# Patient Record
Sex: Male | Born: 2000 | Race: Black or African American | Hispanic: No | Marital: Single | Smoking: Current some day smoker
Health system: Southern US, Community
[De-identification: ages and names within clinical notes are randomized; demographics above are authoritative.]

## PROBLEM LIST (undated history)

## (undated) DIAGNOSIS — J45909 Unspecified asthma, uncomplicated: Secondary | ICD-10-CM

## (undated) DIAGNOSIS — W3400XA Accidental discharge from unspecified firearms or gun, initial encounter: Secondary | ICD-10-CM

## (undated) HISTORY — PX: TONSILLECTOMY: SUR1361

## (undated) HISTORY — PX: ADENOIDECTOMY: SUR15

## (undated) HISTORY — PX: UMBILICAL HERNIA REPAIR: SHX196

---
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
## 2023-06-01 ENCOUNTER — Encounter (HOSPITAL_BASED_OUTPATIENT_CLINIC_OR_DEPARTMENT_OTHER): Payer: Self-pay | Admitting: Orthopedic Surgery

## 2023-06-01 ENCOUNTER — Other Ambulatory Visit: Payer: Self-pay

## 2023-06-06 ENCOUNTER — Ambulatory Visit (HOSPITAL_BASED_OUTPATIENT_CLINIC_OR_DEPARTMENT_OTHER): Admitting: Anesthesiology

## 2023-06-06 ENCOUNTER — Ambulatory Visit (HOSPITAL_BASED_OUTPATIENT_CLINIC_OR_DEPARTMENT_OTHER)

## 2023-06-06 ENCOUNTER — Other Ambulatory Visit: Payer: Self-pay

## 2023-06-06 ENCOUNTER — Encounter (HOSPITAL_BASED_OUTPATIENT_CLINIC_OR_DEPARTMENT_OTHER)
Admission: RE | Disposition: A | Discharge status: Home / Self Care | Payer: Self-pay | Source: Home / Self Care | Attending: Orthopedic Surgery

## 2023-06-06 ENCOUNTER — Encounter (HOSPITAL_BASED_OUTPATIENT_CLINIC_OR_DEPARTMENT_OTHER): Payer: Self-pay | Admitting: Orthopedic Surgery

## 2023-06-06 ENCOUNTER — Ambulatory Visit (HOSPITAL_BASED_OUTPATIENT_CLINIC_OR_DEPARTMENT_OTHER)
Admission: RE | Admit: 2023-06-06 | Discharge: 2023-06-06 | Disposition: A | Discharge status: Home / Self Care | Attending: Orthopedic Surgery | Admitting: Orthopedic Surgery

## 2023-06-06 DIAGNOSIS — F17290 Nicotine dependence, other tobacco product, uncomplicated: Secondary | ICD-10-CM | POA: Insufficient documentation

## 2023-06-06 DIAGNOSIS — S62617A Displaced fracture of proximal phalanx of left little finger, initial encounter for closed fracture: Secondary | ICD-10-CM | POA: Diagnosis not present

## 2023-06-06 DIAGNOSIS — W3400XA Accidental discharge from unspecified firearms or gun, initial encounter: Secondary | ICD-10-CM | POA: Diagnosis not present

## 2023-06-06 DIAGNOSIS — S61432A Puncture wound without foreign body of left hand, initial encounter: Secondary | ICD-10-CM | POA: Diagnosis present

## 2023-06-06 DIAGNOSIS — S62617B Displaced fracture of proximal phalanx of left little finger, initial encounter for open fracture: Secondary | ICD-10-CM

## 2023-06-06 DIAGNOSIS — J45909 Unspecified asthma, uncomplicated: Secondary | ICD-10-CM | POA: Insufficient documentation

## 2023-06-06 HISTORY — PX: INCISION AND DRAINAGE OF WOUND: SHX1803

## 2023-06-06 HISTORY — PX: OPEN REDUCTION INTERNAL FIXATION (ORIF) DISTAL PHALANX: SHX6236

## 2023-06-06 HISTORY — DX: Accidental discharge from unspecified firearms or gun, initial encounter: W34.00XA

## 2023-06-06 SURGERY — IRRIGATION AND DEBRIDEMENT WOUND
Anesthesia: Monitor Anesthesia Care | Site: Little Finger | Laterality: Left

## 2023-06-06 MED ORDER — MIDAZOLAM HCL 2 MG/2ML IJ SOLN
2.0000 mg | Freq: Once | INTRAMUSCULAR | Status: AC
  Administered 2023-06-06: 2 mg via INTRAVENOUS

## 2023-06-06 MED ORDER — ONDANSETRON HCL 4 MG/2ML IJ SOLN
INTRAMUSCULAR | Status: AC
  Filled 2023-06-06: qty 2

## 2023-06-06 MED ORDER — CEFAZOLIN SODIUM-DEXTROSE 2-4 GM/100ML-% IV SOLN
2.0000 g | INTRAVENOUS | Status: AC
  Administered 2023-06-06: 2 g via INTRAVENOUS

## 2023-06-06 MED ORDER — FENTANYL CITRATE (PF) 100 MCG/2ML IJ SOLN
INTRAMUSCULAR | Status: AC
  Filled 2023-06-06: qty 2

## 2023-06-06 MED ORDER — GLYCOPYRROLATE PF 0.2 MG/ML IJ SOSY
PREFILLED_SYRINGE | INTRAMUSCULAR | Status: DC | PRN
  Administered 2023-06-06: .2 mg via INTRAVENOUS

## 2023-06-06 MED ORDER — PROPOFOL 500 MG/50ML IV EMUL
INTRAVENOUS | Status: DC | PRN
  Administered 2023-06-06: 50 ug/kg/min via INTRAVENOUS

## 2023-06-06 MED ORDER — OXYCODONE HCL 5 MG PO TABS: 5.0000 mg | ORAL_TABLET | Freq: Once | ORAL | Status: DC | PRN

## 2023-06-06 MED ORDER — LIDOCAINE 2% (20 MG/ML) 5 ML SYRINGE
INTRAMUSCULAR | Status: AC
  Filled 2023-06-06: qty 5

## 2023-06-06 MED ORDER — OXYCODONE HCL 5 MG PO TABS: 5.0000 mg | ORAL_TABLET | Freq: Four times a day (QID) | ORAL | 0 refills | Status: AC | PRN

## 2023-06-06 MED ORDER — ONDANSETRON HCL 4 MG/2ML IJ SOLN: 4.0000 mg | Freq: Once | INTRAMUSCULAR | Status: DC | PRN

## 2023-06-06 MED ORDER — MIDAZOLAM HCL 5 MG/5ML IJ SOLN
INTRAMUSCULAR | Status: DC | PRN
  Administered 2023-06-06: 2 mg via INTRAVENOUS

## 2023-06-06 MED ORDER — LACTATED RINGERS IV SOLN: INTRAVENOUS | Status: DC

## 2023-06-06 MED ORDER — DROPERIDOL 2.5 MG/ML IJ SOLN: 0.6250 mg | Freq: Once | INTRAMUSCULAR | Status: DC | PRN

## 2023-06-06 MED ORDER — OXYCODONE HCL 5 MG/5ML PO SOLN: 5.0000 mg | Freq: Once | ORAL | Status: DC | PRN

## 2023-06-06 MED ORDER — FENTANYL CITRATE (PF) 100 MCG/2ML IJ SOLN: 25.0000 ug | INTRAMUSCULAR | Status: DC | PRN

## 2023-06-06 MED ORDER — MIDAZOLAM HCL 2 MG/2ML IJ SOLN
INTRAMUSCULAR | Status: AC
  Filled 2023-06-06: qty 2

## 2023-06-06 MED ORDER — DEXMEDETOMIDINE HCL IN NACL 80 MCG/20ML IV SOLN
INTRAVENOUS | Status: DC | PRN
Start: 2023-06-06 — End: 2023-06-06
  Administered 2023-06-06: 8 ug via INTRAVENOUS

## 2023-06-06 MED ORDER — ONDANSETRON HCL 4 MG/2ML IJ SOLN
INTRAMUSCULAR | Status: DC | PRN
Start: 2023-06-06 — End: 2023-06-06
  Administered 2023-06-06: 4 mg via INTRAVENOUS

## 2023-06-06 MED ORDER — 0.9 % SODIUM CHLORIDE (POUR BTL) OPTIME
TOPICAL | Status: DC | PRN
  Administered 2023-06-06: 1000 mL

## 2023-06-06 MED ORDER — FENTANYL CITRATE (PF) 100 MCG/2ML IJ SOLN
100.0000 ug | Freq: Once | INTRAMUSCULAR | Status: AC
  Administered 2023-06-06: 100 ug via INTRAVENOUS

## 2023-06-06 SURGICAL SUPPLY — 34 items
BLADE SURG 15 STRL LF DISP TIS (BLADE) ×1 IMPLANT
BNDG ELASTIC 3INX 5YD STR LF (GAUZE/BANDAGES/DRESSINGS) ×1 IMPLANT
BNDG ESMARK 4X9 LF (GAUZE/BANDAGES/DRESSINGS) ×1 IMPLANT
BNDG GAUZE DERMACEA FLUFF 4 (GAUZE/BANDAGES/DRESSINGS) ×1 IMPLANT
BNDG PLASTER X FAST 3X3 WHT LF (CAST SUPPLIES) IMPLANT
CHLORAPREP W/TINT 26 (MISCELLANEOUS) ×1 IMPLANT
CORD BIPOLAR FORCEPS 12FT (ELECTRODE) ×1 IMPLANT
COVER BACK TABLE 60X90IN (DRAPES) ×1 IMPLANT
CUFF TOURN SGL QUICK 18X4 (TOURNIQUET CUFF) ×1 IMPLANT
CUFF TRNQT CYL 24X4X16.5-23 (TOURNIQUET CUFF) IMPLANT
DRAPE EXTREMITY T 121X128X90 (DISPOSABLE) ×1 IMPLANT
DRAPE OEC MINIVIEW 54X84 (DRAPES) ×1 IMPLANT
DRAPE SURG 17X23 STRL (DRAPES) ×1 IMPLANT
GAUZE SPONGE 4X4 12PLY STRL (GAUZE/BANDAGES/DRESSINGS) ×1 IMPLANT
GAUZE XEROFORM 1X8 LF (GAUZE/BANDAGES/DRESSINGS) ×1 IMPLANT
GLOVE BIO SURGEON STRL SZ7 (GLOVE) ×1 IMPLANT
GOWN STRL REUS W/ TWL LRG LVL3 (GOWN DISPOSABLE) ×2 IMPLANT
NDL HYPO 25X1 1.5 SAFETY (NEEDLE) IMPLANT
NEEDLE HYPO 25X1 1.5 SAFETY (NEEDLE) ×1 IMPLANT
NS IRRIG 1000ML POUR BTL (IV SOLUTION) ×1 IMPLANT
PACK BASIN DAY SURGERY FS (CUSTOM PROCEDURE TRAY) ×1 IMPLANT
PAD CAST 3X4 CTTN HI CHSV (CAST SUPPLIES) ×1 IMPLANT
SHEET MEDIUM DRAPE 40X70 STRL (DRAPES) ×1 IMPLANT
SLEEVE SCD COMPRESS KNEE MED (STOCKING) IMPLANT
SLING ARM FOAM STRAP LRG (SOFTGOODS) IMPLANT
SPLINT FIBERGLASS 4X30 (CAST SUPPLIES) IMPLANT
SUT ETHILON 4 0 PS 2 18 (SUTURE) ×1 IMPLANT
SUT MNCRL AB 3-0 PS2 18 (SUTURE) IMPLANT
SUT VIC AB 4-0 PS2 18 (SUTURE) IMPLANT
SUT VICRYL RAPIDE 4/0 PS 2 (SUTURE) IMPLANT
SYR BULB EAR ULCER 3OZ GRN STR (SYRINGE) ×1 IMPLANT
SYR CONTROL 10ML LL (SYRINGE) IMPLANT
TOWEL GREEN STERILE FF (TOWEL DISPOSABLE) ×2 IMPLANT
UNDERPAD 30X36 HEAVY ABSORB (UNDERPADS AND DIAPERS) ×1 IMPLANT

## 2023-06-06 NOTE — Transfer of Care (Signed)
 Immediate Anesthesia Transfer of Care Note  Patient: Mark Velazquez  Procedure(s) Performed: IRRIGATION AND DEBRIDEMENT WOUND (Left: Little Finger) OPEN REDUCTION INTERNAL FIXATION (ORIF) DISTAL PHALANX (Left: Little Finger)  Patient Location: PACU  Anesthesia Type:MAC combined with regional for post-op pain  Level of Consciousness: drowsy  Airway & Oxygen Therapy: Patient Spontanous Breathing and Patient connected to face mask oxygen  Post-op Assessment: Report given to RN and Post -op Vital signs reviewed and stable  Post vital signs: Reviewed and stable  Last Vitals:  Vitals Value Taken Time  BP 112/66 06/06/23 1230  Temp    Pulse 72 06/06/23 1231  Resp 15 06/06/23 1231  SpO2 100 % 06/06/23 1231  Vitals shown include unfiled device data.  Last Pain:  Vitals:   06/06/23 0908  TempSrc: Temporal  PainSc: 7       Patients Stated Pain Goal: 4 (06/06/23 0908)  Complications: No notable events documented.
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
## 2023-06-06 NOTE — Anesthesia Postprocedure Evaluation (Signed)
 Anesthesia Post Note  Patient: Dispensing optician  Procedure(s) Performed: IRRIGATION AND DEBRIDEMENT WOUND (Left: Little Finger) OPEN REDUCTION INTERNAL FIXATION (ORIF) DISTAL PHALANX (Left: Little Finger)     Patient location during evaluation: PACU Anesthesia Type: Regional and MAC Level of consciousness: awake and alert and oriented Pain management: pain level controlled Vital Signs Assessment: post-procedure vital signs reviewed and stable Respiratory status: spontaneous breathing, nonlabored ventilation and respiratory function stable Cardiovascular status: stable and blood pressure returned to baseline Postop Assessment: no apparent nausea or vomiting Anesthetic complications: no   No notable events documented.  Last Vitals:  Vitals:   06/06/23 1230 06/06/23 1245  BP: 112/66 121/76  Pulse: 73 65  Resp: 16 18  Temp:    SpO2: 100% 100%    Last Pain:  Vitals:   06/06/23 1230  TempSrc:   PainSc: Asleep                 Varvara Legault A.

## 2023-06-06 NOTE — Progress Notes (Signed)
Assisted Dr. Foster with left, supraclavicular, ultrasound guided block. Side rails up, monitors on throughout procedure. See vital signs in flow sheet. Tolerated Procedure well. 
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
## 2023-06-06 NOTE — Discharge Instructions (Addendum)
 Waylan Rocher, M.D. Hand Surgery  POST-OPERATIVE DISCHARGE INSTRUCTIONS   PRESCRIPTIONS: You may have been given a prescription to be taken as directed for post-operative pain control.  You may also take over the counter ibuprofen/aleve and tylenol for pain. Take this as directed on the packaging. Do not exceed 3000 mg tylenol/acetaminophen in 24 hours.  Ibuprofen 600-800 mg (3-4) tablets by mouth every 6 hours as needed for pain.  OR Aleve 2 tablets by mouth every 12 hours (twice daily) as needed for pain.  AND/OR Tylenol 1000 mg (2 tablets) every 8 hours as needed for pain.  Please use your pain medication carefully, as refills are limited and you may not be provided with one.  As stated above, please use over the counter pain medicine - it will also be helpful with decreasing your swelling.    ANESTHESIA: After your surgery, post-surgical discomfort or pain is likely. This discomfort can last several days to a few weeks. At certain times of the day your discomfort may be more intense.   Did you receive a nerve block?  A nerve block can provide pain relief for one hour to two days after your surgery. As long as the nerve block is working, you will experience little or no sensation in the area the surgeon operated on.  As the nerve block wears off, you will begin to experience pain or discomfort. It is very important that you begin taking your prescribed pain medication before the nerve block fully wears off. Treating your pain at the first sign of the block wearing off will ensure your pain is better controlled and more tolerable when full-sensation returns. Do not wait until the pain is intolerable, as the medicine will be less effective. It is better to treat pain in advance than to try and catch up.   General Anesthesia:  If you did not receive a nerve block during your surgery, you will need to start taking your pain medication shortly after your surgery and should continue  to do so as prescribed by your surgeon.     ICE AND ELEVATION: You may use ice for the first 48-72 hours, but it is not critical.   Motion of your fingers is very important to decrease the swelling.  Elevation, as much as possible for the next 48 hours, is critical for decreasing swelling as well as for pain relief. Elevation means when you are seated or lying down, you hand should be at or above your heart. When walking, the hand needs to be at or above the level of your elbow.  If the bandage gets too tight, it may need to be loosened. Please contact our office and we will instruct you in how to do this.    SURGICAL BANDAGES:  Keep your dressing and/or splint clean and dry at all times.  Do not remove until you are seen again in the office.  If careful, you may place a plastic bag over your bandage and tape the end to shower, but be careful, do not get your bandages wet.     HAND THERAPY:  You may not need any. If you do, we will begin this at your follow up visit in the clinic.    ACTIVITY AND WORK: You are encouraged to move any fingers which are not in the bandage.  Light use of the fingers is allowed to assist the other hand with daily hygiene and eating, but strong gripping or lifting is often uncomfortable and  should be avoided.  You might miss a variable period of time from work and hopefully this issue has been discussed prior to surgery. You may not do any heavy work with your affected hand for about 2 weeks.    [facility] Second Floor, [address] [place], [place] [address] [phone]  Post Anesthesia Home Care Instructions  Activity: Get plenty of rest for the remainder of the day. A responsible individual must stay with you for 24 hours following the procedure.  For the next 24 hours, DO NOT: -Drive a car -Advertising copywriter -Drink alcoholic beverages -Take any medication unless instructed by your physician -Make any legal decisions or sign important  papers.  Meals: Start with liquid foods such as gelatin or soup. Progress to regular foods as tolerated. Avoid greasy, spicy, heavy foods. If nausea and/or vomiting occur, drink only clear liquids until the nausea and/or vomiting subsides. Call your physician if vomiting continues.  Special Instructions/Symptoms: Your throat may feel dry or sore from the anesthesia or the breathing tube placed in your throat during surgery. If this causes discomfort, gargle with warm salt water. The discomfort should disappear within 24 hours.  If you had a scopolamine patch placed behind your ear for the management of post- operative nausea and/or vomiting:  1. The medication in the patch is effective for 72 hours, after which it should be removed.  Wrap patch in a tissue and discard in the trash. Wash hands thoroughly with soap and water. 2. You may remove the patch earlier than 72 hours if you experience unpleasant side effects which may include dry mouth, dizziness or visual disturbances. 3. Avoid touching the patch. Wash your hands with soap and water after contact with the patch.       Regional Anesthesia Blocks  1. You may not be able to move or feel the "blocked" extremity after a regional anesthetic block. This may last may last from 3-48 hours after placement, but it will go away. The length of time depends on the medication injected and your individual response to the medication. As the nerves start to wake up, you may experience tingling as the movement and feeling returns to your extremity. If the numbness and inability to move your extremity has not gone away after 48 hours, please call your surgeon.   2. The extremity that is blocked will need to be protected until the numbness is gone and the strength has returned. Because you cannot feel it, you will need to take extra care to avoid injury. Because it may be weak, you may have difficulty moving it or using it. You may not know what position it  is in without looking at it while the block is in effect.  3. For blocks in the legs and feet, returning to weight bearing and walking needs to be done carefully. You will need to wait until the numbness is entirely gone and the strength has returned. You should be able to move your leg and foot normally before you try and bear weight or walk. You will need someone to be with you when you first try to ensure you do not fall and possibly risk injury.  4. Bruising and tenderness at the needle site are common side effects and will resolve in a few days.  5. Persistent numbness or new problems with movement should be communicated to the surgeon or the [facility] [phone] [facility] [phone]).
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
## 2023-06-06 NOTE — Anesthesia Procedure Notes (Signed)
 Procedure Name: MAC Date/Time: 06/06/2023 11:26 AM  Performed by: Lucky Sable, CRNAPre-anesthesia Checklist: Timeout performed, Patient being monitored, Suction available, Emergency Drugs available and Patient identified Patient Re-evaluated:Patient Re-evaluated prior to induction Oxygen Delivery Method: Simple face mask Preoxygenation: Pre-oxygenation with 100% oxygen Induction Type: IV induction Placement Confirmation: breath sounds checked- equal and bilateral, CO2 detector and positive ETCO2

## 2023-06-07 ENCOUNTER — Encounter (HOSPITAL_BASED_OUTPATIENT_CLINIC_OR_DEPARTMENT_OTHER): Payer: Self-pay | Admitting: Orthopedic Surgery
